# Patient Record
Sex: Female | Born: 1975 | Race: White | Hispanic: No | Marital: Single | Smoking: Current every day smoker
Health system: Southern US, Community
[De-identification: ages and names within clinical notes are randomized; demographics above are authoritative.]

## PROBLEM LIST (undated history)

## (undated) DIAGNOSIS — O24419 Gestational diabetes mellitus in pregnancy, unspecified control: Secondary | ICD-10-CM

## (undated) DIAGNOSIS — O3410 Maternal care for benign tumor of corpus uteri, unspecified trimester: Secondary | ICD-10-CM

## (undated) DIAGNOSIS — R87619 Unspecified abnormal cytological findings in specimens from cervix uteri: Secondary | ICD-10-CM

## (undated) DIAGNOSIS — D219 Benign neoplasm of connective and other soft tissue, unspecified: Secondary | ICD-10-CM

## (undated) DIAGNOSIS — K429 Umbilical hernia without obstruction or gangrene: Secondary | ICD-10-CM

## (undated) DIAGNOSIS — N879 Dysplasia of cervix uteri, unspecified: Secondary | ICD-10-CM

## (undated) DIAGNOSIS — D259 Leiomyoma of uterus, unspecified: Secondary | ICD-10-CM

## (undated) DIAGNOSIS — R5383 Other fatigue: Secondary | ICD-10-CM

## (undated) DIAGNOSIS — Z8742 Personal history of other diseases of the female genital tract: Secondary | ICD-10-CM

## (undated) HISTORY — DX: Gestational diabetes mellitus in pregnancy, unspecified control: O24.419

## (undated) HISTORY — DX: Dysplasia of cervix uteri, unspecified: N87.9

## (undated) HISTORY — DX: Benign neoplasm of connective and other soft tissue, unspecified: D21.9

## (undated) HISTORY — DX: Leiomyoma of uterus, unspecified: D25.9

## (undated) HISTORY — DX: Maternal care for benign tumor of corpus uteri, unspecified trimester: O34.10

## (undated) HISTORY — DX: Other fatigue: R53.83

## (undated) HISTORY — DX: Umbilical hernia without obstruction or gangrene: K42.9

## (undated) HISTORY — PX: CERVICAL CONE BIOPSY: SUR198

## (undated) HISTORY — DX: Unspecified abnormal cytological findings in specimens from cervix uteri: R87.619

## (undated) HISTORY — DX: Personal history of other diseases of the female genital tract: Z87.42

---

## 2003-08-17 ENCOUNTER — Observation Stay (HOSPITAL_COMMUNITY): Admission: EM | Admit: 2003-08-17 | Discharge: 2003-08-17 | Payer: Self-pay | Admitting: Emergency Medicine

## 2003-08-17 ENCOUNTER — Encounter: Payer: Self-pay | Admitting: Emergency Medicine

## 2003-08-25 ENCOUNTER — Encounter (HOSPITAL_COMMUNITY): Admission: RE | Admit: 2003-08-25 | Discharge: 2003-09-24 | Payer: Self-pay | Admitting: *Deleted

## 2006-02-22 ENCOUNTER — Ambulatory Visit (HOSPITAL_COMMUNITY): Admission: RE | Admit: 2006-02-22 | Discharge: 2006-02-22 | Payer: Self-pay | Admitting: Family Medicine

## 2006-05-03 ENCOUNTER — Ambulatory Visit (HOSPITAL_COMMUNITY): Admission: RE | Admit: 2006-05-03 | Discharge: 2006-05-03 | Payer: Self-pay | Admitting: Family Medicine

## 2006-09-14 ENCOUNTER — Encounter (INDEPENDENT_AMBULATORY_CARE_PROVIDER_SITE_OTHER): Payer: Self-pay | Admitting: Specialist

## 2006-09-14 ENCOUNTER — Inpatient Hospital Stay (HOSPITAL_COMMUNITY): Admission: RE | Admit: 2006-09-14 | Discharge: 2006-09-16 | Payer: Self-pay | Admitting: Obstetrics & Gynecology

## 2008-02-26 IMAGING — US US OB COMP +14 WK
1 series · 14 of 28 positions shown · non-contrast
Comparison: none

CLINICAL DATA: Evaluation of fetal anatomy.

[Series 1: unknown · 0.32mm/px · 14 of 68 slices shown]
[im 3/68]
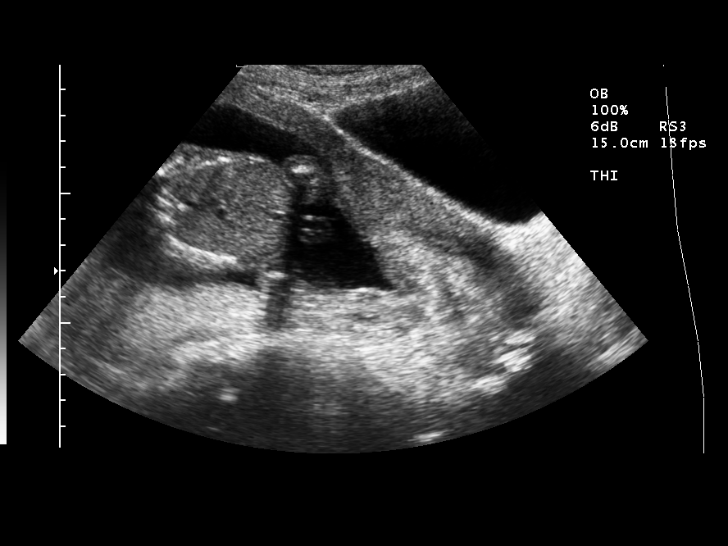
[im 8/68]
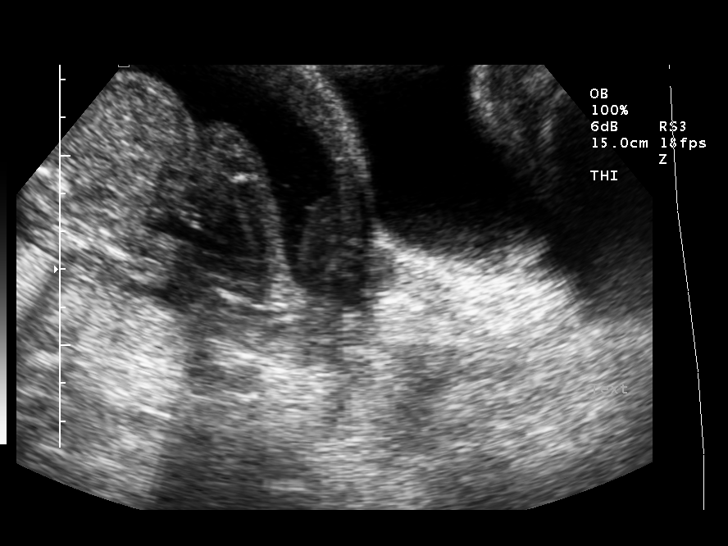
[im 13/68]
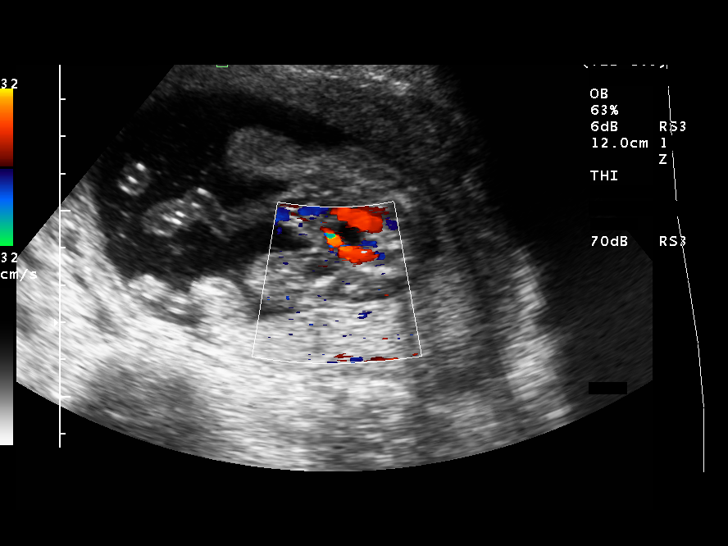
[im 18/68]
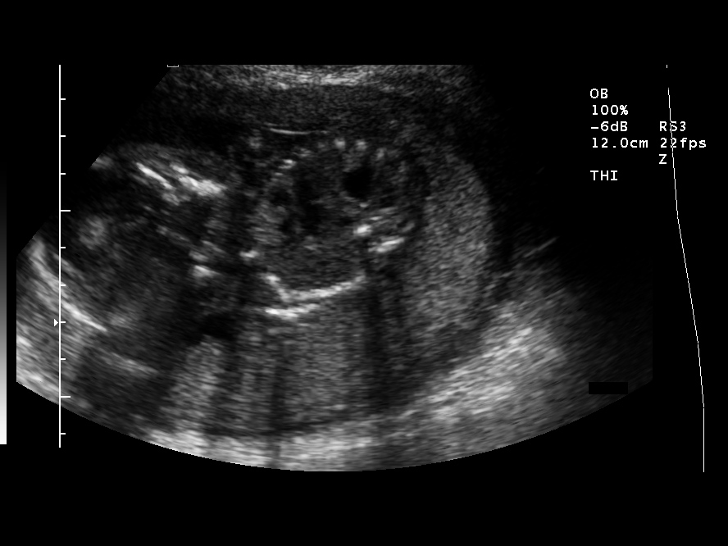
[im 23/68]
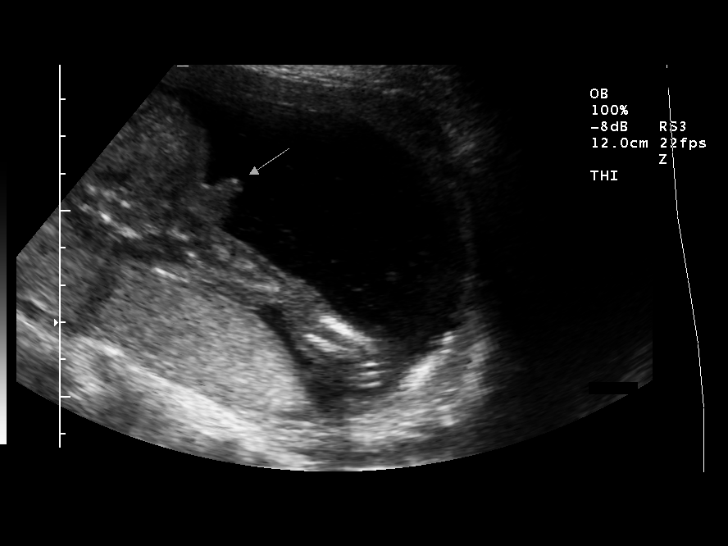
[im 28/68]
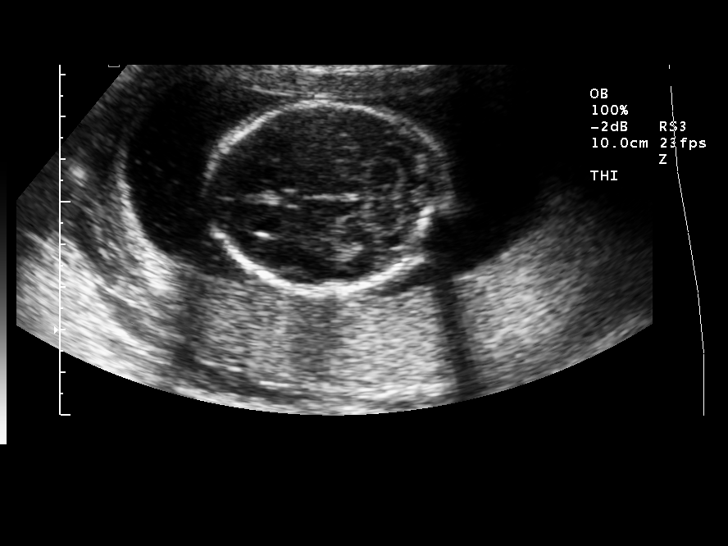
[im 33/68]
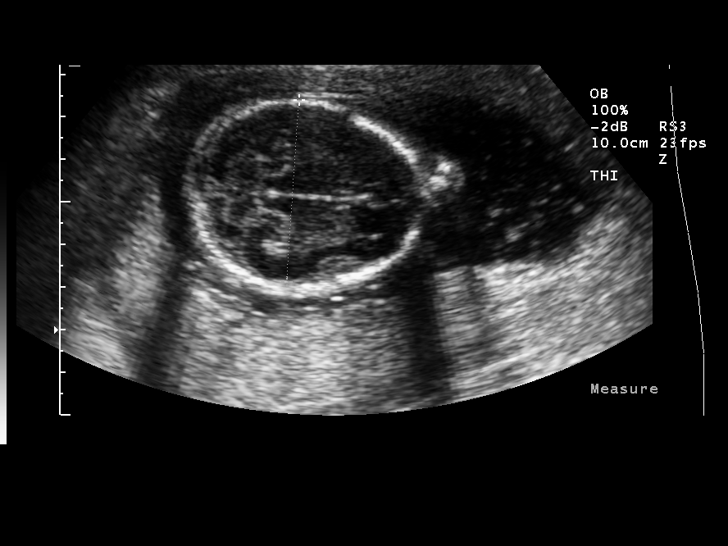
[im 38/68]
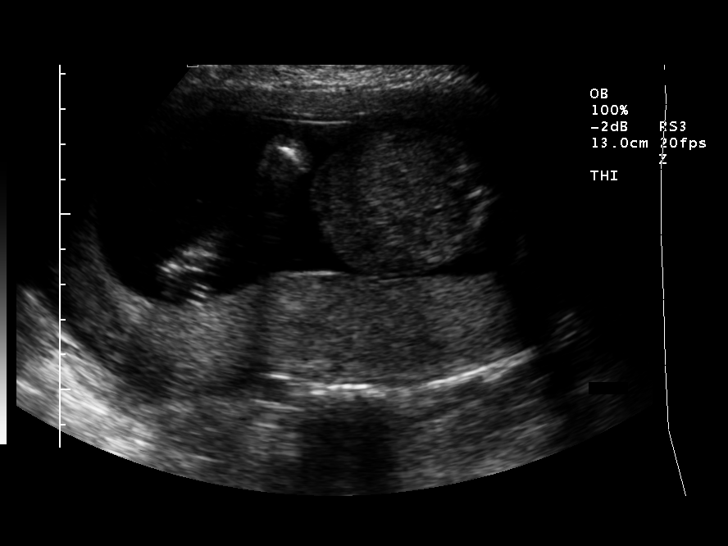
[im 43/68]
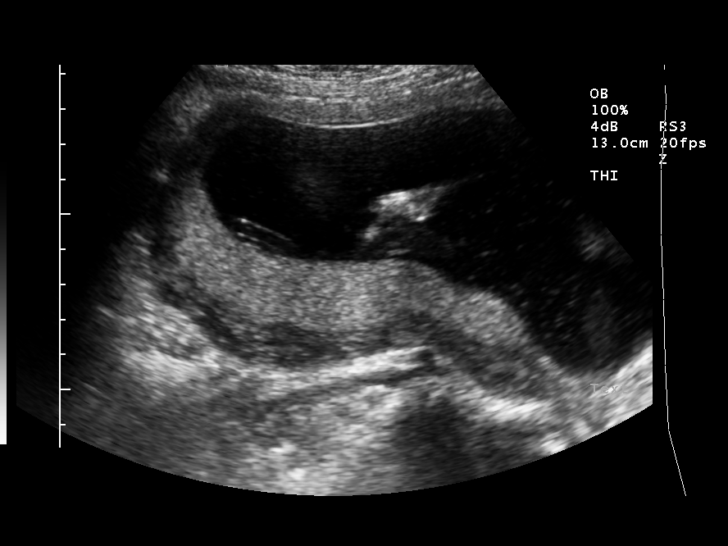
[im 48/68]
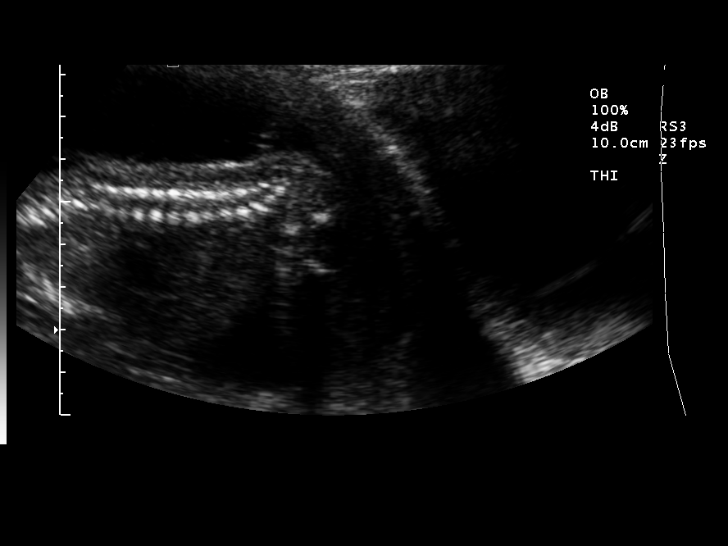
[im 53/68]
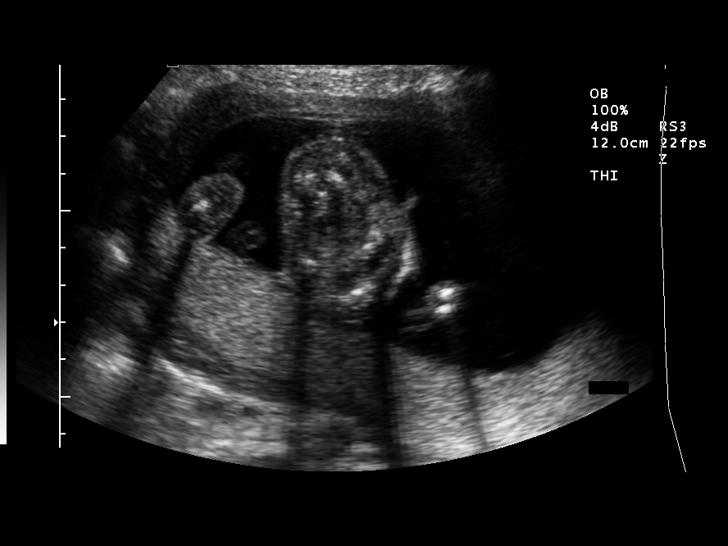
[im 58/68]
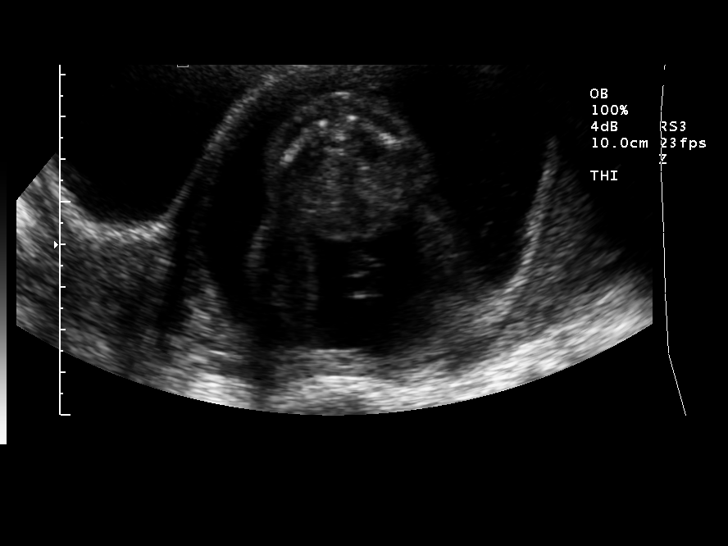
[im 63/68]
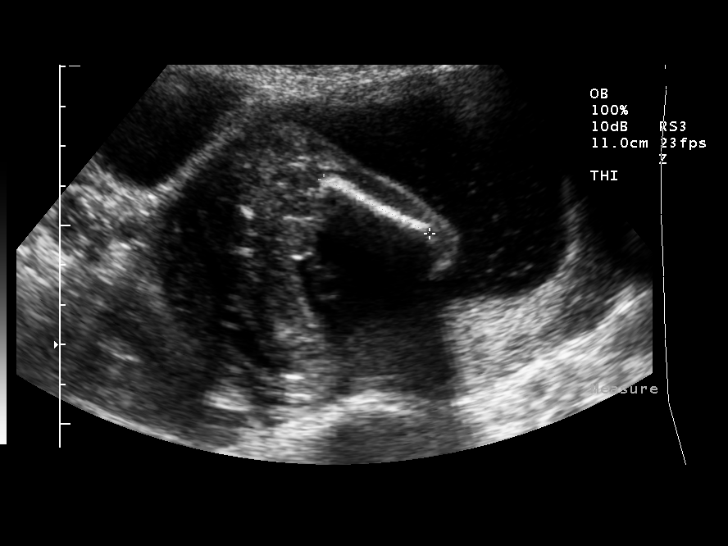
[im 68/68]
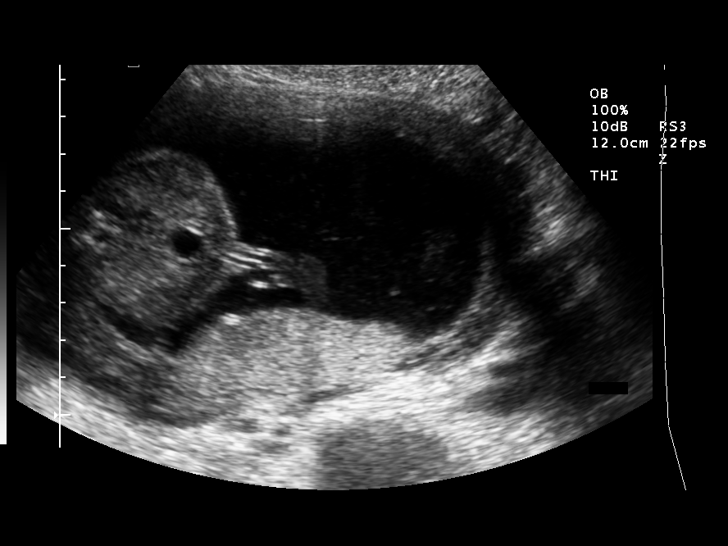

[14 of 28 positions shown; findings below may reference images not displayed]

OBSTETRICAL ULTRASOUND:
 Number of Fetuses: 1
 Heart Rate:  145
 Movement:  Yes
 Breathing:  No  
 Presentation:  Breech
 Placental Location:  Posterior
 Grade:  I
 Previa:  No
 Amniotic Fluid (Subjective):  Normal
 Amniotic Fluid (Objective): 

 FETAL BIOMETRY
 BPD:   4.3 cm   19 w 0 d
 HC:   16.3 cm   19 w 0 d
 AC:   14.3 cm   19 w 5 d
 FL:    2.9 cm   19 w 0 d

 MEAN GA:  19 w 1 d  US EDC:  09/26/06
 Assigned GA:  18 w 5 d  Assigned EDC:  09/29/06    

 FETAL ANATOMY
 Lateral Ventricles:    Visualized 
 Thalami/CSP:      Visualized 
 Posterior Fossa:  Visualized 
 Nuchal Region:    Visualized 
 Spine:      Visualized 
 4 Chamber Heart on Left:      Visualized 
 Stomach on Left:      Visualized 
 3 Vessel Cord:    Visualized 
 Cord Insertion site:    Visualized 
 Kidneys:  Visualized 
 Bladder:  Visualized 
 Extremities:      Visualized 

 ADDITIONAL ANATOMY VISUALIZED:  Diaphragm, 5th digit, and male genitalia.

 MATERNAL UTERINE AND ADNEXAL FINDINGS
 Cervix: 4.6 cm transabdominally
IMPRESSION: Normal appearing single intrauterine pregnancy of approximately 19 weeks 1 day gestation.

## 2010-04-08 ENCOUNTER — Emergency Department (HOSPITAL_COMMUNITY): Admission: EM | Admit: 2010-04-08 | Discharge: 2010-04-08 | Payer: Self-pay | Admitting: Emergency Medicine

## 2010-08-16 ENCOUNTER — Other Ambulatory Visit: Admission: RE | Admit: 2010-08-16 | Discharge: 2010-08-16 | Payer: Self-pay | Admitting: Obstetrics & Gynecology

## 2011-02-20 ENCOUNTER — Inpatient Hospital Stay (HOSPITAL_COMMUNITY)
Admission: AD | Admit: 2011-02-20 | Discharge: 2011-02-22 | DRG: 775 | Disposition: A | Discharge status: Home / Self Care | Payer: Medicaid Other | Source: Ambulatory Visit | Attending: Obstetrics & Gynecology | Admitting: Obstetrics & Gynecology

## 2011-02-20 DIAGNOSIS — O34219 Maternal care for unspecified type scar from previous cesarean delivery: Secondary | ICD-10-CM | POA: Diagnosis present

## 2011-02-20 LAB — CBC
HCT: 38.5 % (ref 36.0–46.0)
Hemoglobin: 13.1 g/dL (ref 12.0–15.0)
MCH: 33.3 pg (ref 26.0–34.0)
MCHC: 34 g/dL (ref 30.0–36.0)
MCV: 98 fL (ref 78.0–100.0)
Platelets: 142 10*3/uL — ABNORMAL LOW (ref 150–400)
RBC: 3.93 MIL/uL (ref 3.87–5.11)
RDW: 14 % (ref 11.5–15.5)
WBC: 7 10*3/uL (ref 4.0–10.5)

## 2011-02-20 LAB — RPR: RPR Ser Ql: NONREACTIVE

## 2011-03-09 NOTE — H&P (Signed)
NAME:  Madison [name], Madison [name]                       ACCOUNT NO.:  [id number]   MEDICAL RECORD NO.:  [id number]                   PATIENT TYPE:  OBV   LOCATION:  A428                                 FACILITY:  [facility]   PHYSICIAN:  Langley Gauss, M.D.                DATE OF BIRTH:  03/01/1976   DATE OF ADMISSION:  08/16/2003  DATE OF DISCHARGE:                                HISTORY & PHYSICAL   OBSERVATION NOTE   HISTORY OF PRESENT ILLNESS:  This is a 35 year old gravida 1 para 0 with a  normal last menstrual period of July 13, 2003 who presented to [facility] emergency room as an unassigned patient in the late p.m. of August 16, 2003.  The patient had had a positive pregnancy test three days previously  and had made arrangements to be seen at [facility] for a new  pregnancy.  However, on the date of service - August 16, 2003 - the patient  in the early afternoon began having some vagina spotting which was followed  by onset of mid pelvic and right lower quadrant pelvic pain at about 1900.  The patient presented and initial triage assessment in the emergency room  done 2151 on August 16, 2003.  The patient's presenting complaint was that  of pregnant, abdominal pain.  Stated she did have a home pregnancy test that  was positive, started spotting today, and has abdominal pain.   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  Vaginal spotting was also  occurring during that point in time.  The patient presented to the emergency  room with a moderately-severe pain but as she laid in the emergency room  awaiting and during her evaluation by 0100 August 17, 2003 the pain had  markedly improved.  Notably, the patient did not receive any narcotic  medication.   The patient describes menstrual periods as being regular,  predictable, on a  monthly basis, with four days of flow.  Last menstrual period was described  as normal.  She has been off birth control pills for greater than one year.  Of course, she is sexually active.  The patient does state that this is her  first pregnancy, she has no other pregnancies.  In addition, her past  history is pertinent for no prior history of GC or chlamydia and no prior  surgical procedures, no prior history of STDs or pelvic infections.  She has  been treated x1 for bacterial vaginosis with Flagyl.   PAST MEDICAL HISTORY:  She has an abnormal Pap smear four years previously  which was treated by freezing the cervix.  She states she has been followed  at [facility] and has had yearly Paps since that point  in time.  She  has no other medical or surgical history.   ALLERGIES:  No known drug allergies.   SOCIAL HISTORY:  Gravid 1 para 0.  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  The patient is noted to be a cigarette smoker.   PHYSICAL EXAMINATION:  GENERAL:  Initial evaluation by the triage staff  described the patient as looking sick with the chief complaint of abdominal  pain.  VITAL SIGNS IN EMERGENCY ROOM:  Temperature 97.3, blood pressure 102/68,  heart rate of 73, respiratory rate is 20.  HEENT:  Negative, no adenopathy.  Neck is supple, thyroid is nonpalpable,  and mucous membranes are moist.  LUNGS:  Clear.  CARDIOVASCULAR:  Regular rate and rhythm.  ABDOMEN:  Soft and nontender, nondistended.  There is mild tenderness in the  right lower quadrant as well as the suprapubic area.  No adnexal masses are  palpated.  No peritoneal signs, no rebound or guarding.  PELVIC:  Normal external genitalia. Cervix noted to be without lesions.  GC  and chlamydia culture is performed.  Bimanual examination shows the uterus  is normal size, mild tenderness within the right lower quadrant, no palpable  masses, suggestion of some free fluid within the right  adnexa.   LABORATORY DATA:  A quantitative beta hCG obtained is result of 541.  Urinalysis essentially completely negative with esterase negative, nitrites  negative, protein negative.  Wet prep pertinent for no yeast, no  Trichomonas, no clue cells.  O positive blood type.  Hemoglobin 12.1,  hematocrit 34.8, with a white count of 6.6.  Transvaginal ultrasound  performed August 17, 2003:  The hospital was able to call in a technician  for performance of this ultrasound.  The initial preliminary report obtained  was that of no intrauterine pregnancy, moderate free fluid in the pelvis,  normal left ovary, small complex cystic structure within the right adnexa  measured at 13 mm without increased vascularity.  Findings worrisome for  ectopic but not definitive.  Subsequently, a dictated final report is  obtained which reveals moderate amount of fluid within the pelvis, uterus  with normal uterine size and contour, endometrium and myometrium normal,  left ovary normal, right ovary shows two small cystic areas adjacent to or  pedunculated off the ovary inferiorly.  Final impression on the final report  is ectopic pregnancy cannot be ruled out.   ASSESSMENT:  The patient would be five and one-seventh weeks gestation based  upon a certain last menstrual period.  She presented to the emergency room  with signs and symptoms as well as findings worrisome for ectopic pregnancy.  In addition, the transvaginal ultrasound did not reveal an intrauterine  pregnancy.  The free fluid within the pelvis as well as the small complex  cystic area within right adnexa likewise were worrisome for an ectopic but  the quantitative beta hCG obtained is a very low number at 541.  At a low  number of 541 it is normal not to see evidence of any intrauterine  pregnancy.  In addition, the patient is evaluated by myself August 17, 2003 on the fourth floor with serial examinations.  Most pertinently, the patient  in  addition was likewise noted to have presented with a mid pelvic right  lower quadrant pain and some spotting.  During the p.m. and early a.m. of  August 17, 2003 this pain completely subsided with no additional spotting  and examination was completely benign with minimal tenderness  only within  the right lower quadrant.  The patient was ambulatory with no recurrence of  the pain.  She continued to have breast tenderness but no additional crampy  stabbing pain by symptoms or reproducible on examination.  She tolerated a  regular general diet, was very hungry, and was anxious for discharge.   DISPOSITION AT THIS TIME:  The patient still needs to be followed closely as  we have not documented intrauterine pregnancy.  The patient is advised that  should pelvic cramping recur or vaginal bleeding recur, she can contact us  and be seen in the office during the daytime or followed up through the  emergency room after hours.  She is given specific instructions to follow up  in 48 hours for repeat quantitative beta hCG a.m. of August 19, 2003.  Subsequently, she will have an ultrasound performed in our office at 1400 on  August 19, 2003.  Discussed at length with the patient her current  findings.  She is also made aware that we have not confirmed intrauterine  pregnancy nor have we completely ruled out an ectopic pregnancy.     ___________________________________________                                         Langley Gauss, M.D.   DC/MEDQ  D:  08/17/2003  T:  08/17/2003  Job:  [id number]   cc:   [facility]

## 2011-03-09 NOTE — H&P (Signed)
   NAME:  AMEIRAH, KHATOON                       ACCOUNT NO.:  [id number]   MEDICAL RECORD NO.:  [id number]                   PATIENT TYPE:  OBV   LOCATION:  A428                                 FACILITY:  [facility]   PHYSICIAN:  Langley Gauss, M.D.                DATE OF BIRTH:  [birth year]   DATE OF ADMISSION:  08/16/2003  DATE OF DISCHARGE:                                HISTORY & PHYSICAL   ADDENDUM:  The patient presented to the emergency room in the late p.m. of  August 16, 2003, with positive pregnancy test and cramping.  The patient  subsequently was evaluated by the physician's assistant, as well as by the  ER physician, Dr. Colon Branch.  After evaluation, I was contacted personally by  Dr. Colon Branch who summarized the patient's history.  The patient was clinically  stable, however, it was determined that an observation period in the  hospital would be required so that her clinical course should be followed.  Thus, I gave verbal orders to convert the patient to an observation status  on August 17, 2003.  The patient was subsequently completely managed by  myself on August 17, 2003.  After complete evaluation and observation  period, the patient likewise was discharged home on August 17, 2003.     ___________________________________________                                         Langley Gauss, M.D.   DC/MEDQ  D:  08/17/2003  T:  08/17/2003  Job:  [id number]

## 2011-03-09 NOTE — Discharge Summary (Signed)
Madison, [name] NO.:  [id number]   MEDICAL RECORD NO.:  [id number]          PATIENT TYPE:  INP   LOCATION:  A401                          FACILITY:  [facility]   PHYSICIAN:  Lazaro Arms, M.D.   DATE OF BIRTH:  Aug 30, 1976   DATE OF ADMISSION:  09/14/2006  DATE OF DISCHARGE:  11/26/2007LH                               DISCHARGE SUMMARY   DISCHARGE DIAGNOSES:  1. Status post a primary cesarean section due to cord presentation and      labor at 6 cm.  2. Unremarkable postoperative course.   PROCEDURE:  Primary cesarean section.   Please refer to the handwritten history and physical on the antepartum  chart for details of admission to the hospital.   HOSPITAL COURSE:  Patient was admitted 38-1/2 weeks in labor.  She was 4  cm at the time of admission and then pretty quickly went to 6 cm.  I was  examined her and was going to rupture her membranes when I felt the  umbilical cord pulsating.  As a result, I refrained from rupturing  membranes and made preparations to proceed with primary cesarean  section.  Please refer to the operative note for details of the cesarean  section.  It was uncomplicated.  She had an appropriate amount of blood  loss.  I had no intraoperative complications.  Postoperatively she did  well.  She tolerated clear liquids and a regular diet, voided without  symptoms and was extensively ambulatory.   Her postoperative day #1 hemoglobin was 10, hematocrit 28.7.  Again, she  remained afebrile throughout.  Her incision was clean, dry and intact at  the time of discharge.  She was discharged to home on the morning of  postoperative day #2 in good stable condition with follow-up in the  office next week to have her staples removed and incision evaluated.   DISCHARGE MEDICATIONS:  She was discharged to home on Tylox and Motrin.      Lazaro Arms, M.D.  Electronically Signed     LHE/MEDQ  D:  11/07/2006  T:  11/07/2006  Job:  [id number]

## 2011-03-09 NOTE — Op Note (Signed)
Madison [name], Madison [name] NO.:  [id number]   MEDICAL RECORD NO.:  [id number]          PATIENT TYPE:  INP   LOCATION:  A401                          FACILITY:  [facility]   PHYSICIAN:  Lazaro Arms, M.D.   DATE OF BIRTH:  04/21/1976   DATE OF PROCEDURE:  09/14/2006  DATE OF DISCHARGE:                                 OPERATIVE REPORT   PREOPERATIVE DIAGNOSES:  1. Intrauterine pregnancy at 38-1/[redacted] weeks gestation.  2. Labor at 6 cm.  3. Cord presentation.   POSTOPERATIVE DIAGNOSES:  1. Intrauterine pregnancy at 38-1/[redacted] weeks gestation.  2. Labor at 6 cm.  3. Cord presentation.   PROCEDURE:  Primary low transverse cesarean section.   SURGEON:  Lazaro Arms, M.D.   ANESTHESIA:  Spinal.   FINDINGS:  Patient came in this morning at 4 cm dilated in spontaneous  labor.  She progressed to 5, and when I checked her at approximately 12:45,  she was 6 cm.  I was going to rupture her membranes but felt umbilical cord  on the left side of the head.  I did a vaginal probe ultrasound that  confirmed the presentation of the cord.  As a result, I did not perform an  amniotomy and then performed a cesarean section.  She delivered a viable  female infant at 39 with Apgars of 9 and 9 with quick return to nursery.  There was three vessel cord.  Cord blood and cord gas sent.  Placenta was  normal and intact.  The uterus, tubes, and ovaries were normal.   DESCRIPTION OF OPERATION:  Patient was taken to the operating room, placed  in a sitting position with spinal anesthetic.  Prepped and draped in the  usual sterile fashion.  A Pfannenstiel skin incision was made, carried down  sharply to the rectus fascia, scored in midline, extended laterally.  The  fascia was taken off the muscle superiorly and inferiorly without  difficulty.  The muscles were divided.  The peritoneal cavity was entered.  A large protractor was placed.  The vesicouterine serosal flap was created.  A low transverse  hysterotomy incision was made.  Over the incision was  delivered a viable female infant at 56 with Apgars of 9 and 9 with quick  return to the nursery.  A three vessel cord, cord blood, and cord gas were  sent.  The infant was handed to Dr. Regino Schultze, who was in attendance, for  routine neonatal resuscitation.  The placenta was delivered spontaneously.  The uterus was exteriorized and closed in two layers, the first being a  running interlocking layer, the second being an imbricating layer.  Interrupted figure-of-eight sutures were also placed for additional  hemostasis.  The pelvis was irrigated vigorously, all pedicles found to be  hemostatic.  The protractor was removed.  The muscles and perineum were  closed in an interrupted fashion loosely.  The fascia was closed with 0  Vicryl running.  Subcutaneous tissue was made hemostatic and  irrigated.  The skin was closed using skin staples.  The patient tolerated  the procedure well.  She experienced 500 cc of blood loss, taken to the  recovery room in good, stable condition.  All counts were correct x3.  She  received Ancef prophylactically after the cord was clamped.      Lazaro Arms, M.D.  Electronically Signed     LHE/MEDQ  D:  09/14/2006  T:  09/14/2006  Job:  [id number]

## 2013-11-30 ENCOUNTER — Encounter: Payer: Self-pay | Admitting: Adult Health

## 2013-11-30 ENCOUNTER — Ambulatory Visit (INDEPENDENT_AMBULATORY_CARE_PROVIDER_SITE_OTHER): Payer: BC Managed Care – PPO | Admitting: Adult Health

## 2013-11-30 ENCOUNTER — Other Ambulatory Visit (HOSPITAL_COMMUNITY)
Admission: RE | Admit: 2013-11-30 | Discharge: 2013-11-30 | Disposition: A | Discharge status: Home / Self Care | Payer: BC Managed Care – PPO | Source: Ambulatory Visit | Attending: Adult Health | Admitting: Adult Health

## 2013-11-30 ENCOUNTER — Encounter (INDEPENDENT_AMBULATORY_CARE_PROVIDER_SITE_OTHER): Payer: Self-pay

## 2013-11-30 VITALS — BP 110/60 | HR 78 | Ht 62.0 in | Wt 166.0 lb

## 2013-11-30 DIAGNOSIS — Z8742 Personal history of other diseases of the female genital tract: Secondary | ICD-10-CM

## 2013-11-30 DIAGNOSIS — K429 Umbilical hernia without obstruction or gangrene: Secondary | ICD-10-CM

## 2013-11-30 DIAGNOSIS — D219 Benign neoplasm of connective and other soft tissue, unspecified: Secondary | ICD-10-CM

## 2013-11-30 DIAGNOSIS — Z01419 Encounter for gynecological examination (general) (routine) without abnormal findings: Secondary | ICD-10-CM | POA: Insufficient documentation

## 2013-11-30 DIAGNOSIS — R5383 Other fatigue: Secondary | ICD-10-CM

## 2013-11-30 DIAGNOSIS — Z1151 Encounter for screening for human papillomavirus (HPV): Secondary | ICD-10-CM | POA: Insufficient documentation

## 2013-11-30 HISTORY — DX: Benign neoplasm of connective and other soft tissue, unspecified: D21.9

## 2013-11-30 HISTORY — DX: Personal history of other diseases of the female genital tract: Z87.42

## 2013-11-30 HISTORY — DX: Other fatigue: R53.83

## 2013-11-30 LAB — CBC
HEMATOCRIT: 39.9 % (ref 36.0–46.0)
HEMOGLOBIN: 13.8 g/dL (ref 12.0–15.0)
MCH: 32.5 pg (ref 26.0–34.0)
MCHC: 34.6 g/dL (ref 30.0–36.0)
MCV: 93.9 fL (ref 78.0–100.0)
Platelets: 186 10*3/uL (ref 150–400)
RBC: 4.25 MIL/uL (ref 3.87–5.11)
RDW: 13.2 % (ref 11.5–15.5)
WBC: 6.5 10*3/uL (ref 4.0–10.5)

## 2013-11-30 NOTE — Patient Instructions (Signed)
Physical in 1 year Mammogram at 42 Take folic acid Follow up labs in 24-48 hours

## 2013-11-30 NOTE — Progress Notes (Signed)
Patient ID: Madison [name], female   DOB: [birth year], 38 y.o.   MRN: [id number] History of Present Illness: Madison [name] is a 38 year old white female in for a pap and physical.She does complain of being tired, periods heavy sometimes, has history of fibroids.She thinks she would like one more child.   Current Medications, Allergies, Past Medical History, Past Surgical History, Family History and Social History were reviewed in [employer] record.     Review of Systems: Patient denies any headaches, blurred vision, shortness of breath, chest pain, abdominal pain, problems with bowel movements(goes every 2-3 days), urination, or intercourse. No joint pain or mood swings.    Physical Exam:BP 110/60  Pulse 78  Ht 5\' 2"  (1.575 m)  Wt 166 lb (75.297 kg)  BMI 30.35 kg/m2  LMP 11/16/2013 General:  Well developed, well nourished, no acute distress Skin:  Warm and dry Neck:  Midline trachea, normal thyroid Lungs; Clear to auscultation bilaterally Breast:  No dominant palpable mass, retraction, or nipple discharge Cardiovascular: Regular rate and rhythm Abdomen:  Soft, non tender, no hepatosplenomegaly, has small umbilical hernia Pelvic:  External genitalia is normal in appearance.  The vagina is normal in appearance.  The cervix is bulbous.Pap with HPV performed.  Uterus is felt to be ULNS.  No adnexal masses or tenderness noted Extremities:  No swelling or varicosities noted Psych:  No mood changes, alert and cooperative, seems happy   Impression: Yearly gyn exam Fatigue Fibroids Umbilical hernia  History of abnormal pap    Plan: Take folic acid Check CBC,CMP,TSH and lipids and A1c Physical in 1 year Will follow up labs in 24-48 hours

## 2013-12-01 ENCOUNTER — Telehealth: Payer: Self-pay | Admitting: Adult Health

## 2013-12-01 LAB — HEMOGLOBIN A1C
HEMOGLOBIN A1C: 5.4 % (ref <5.7)
Mean Plasma Glucose: 108 mg/dL (ref <117)

## 2013-12-01 LAB — COMPREHENSIVE METABOLIC PANEL
ALBUMIN: 4.4 g/dL (ref 3.5–5.2)
ALK PHOS: 56 U/L (ref 39–117)
ALT: 14 U/L (ref 0–35)
AST: 15 U/L (ref 0–37)
BUN: 11 mg/dL (ref 6–23)
CALCIUM: 8.8 mg/dL (ref 8.4–10.5)
CHLORIDE: 104 meq/L (ref 96–112)
CO2: 27 meq/L (ref 19–32)
Creat: 0.75 mg/dL (ref 0.50–1.10)
GLUCOSE: 74 mg/dL (ref 70–99)
POTASSIUM: 3.9 meq/L (ref 3.5–5.3)
SODIUM: 139 meq/L (ref 135–145)
TOTAL PROTEIN: 6.5 g/dL (ref 6.0–8.3)
Total Bilirubin: 0.6 mg/dL (ref 0.2–1.2)

## 2013-12-01 LAB — LIPID PANEL
Cholesterol: 138 mg/dL (ref 0–200)
HDL: 46 mg/dL (ref >39)
LDL CALC: 70 mg/dL (ref 0–99)
Total CHOL/HDL Ratio: 3 Ratio
Triglycerides: 110 mg/dL (ref <150)
VLDL: 22 mg/dL (ref 0–40)

## 2013-12-01 LAB — TSH: TSH: 0.528 u[IU]/mL (ref 0.350–4.500)

## 2013-12-01 NOTE — Telephone Encounter (Signed)
Pt aware of labs  

## 2014-08-23 ENCOUNTER — Encounter: Payer: Self-pay | Admitting: Adult Health

## 2017-12-16 ENCOUNTER — Other Ambulatory Visit (HOSPITAL_COMMUNITY)
Admission: RE | Admit: 2017-12-16 | Discharge: 2017-12-16 | Disposition: A | Discharge status: Home / Self Care | Payer: BLUE CROSS/BLUE SHIELD | Source: Ambulatory Visit | Attending: Obstetrics & Gynecology | Admitting: Obstetrics & Gynecology

## 2017-12-16 ENCOUNTER — Ambulatory Visit (INDEPENDENT_AMBULATORY_CARE_PROVIDER_SITE_OTHER): Payer: BLUE CROSS/BLUE SHIELD | Admitting: Obstetrics & Gynecology

## 2017-12-16 ENCOUNTER — Other Ambulatory Visit: Payer: Self-pay

## 2017-12-16 ENCOUNTER — Encounter: Payer: Self-pay | Admitting: Obstetrics & Gynecology

## 2017-12-16 VITALS — BP 98/62 | HR 99 | Ht 62.0 in | Wt 125.0 lb

## 2017-12-16 DIAGNOSIS — Z01419 Encounter for gynecological examination (general) (routine) without abnormal findings: Secondary | ICD-10-CM | POA: Insufficient documentation

## 2017-12-16 NOTE — Progress Notes (Signed)
Subjective:     Madison [name] is a 42 y.o. female here for a routine exam.  Patient's last menstrual period was 12/09/2017 (exact date). [id number] Birth Control Method:  condoms Menstrual Calendar(currently): regular, a little irregylar lately  Current complaints: none.   Current acute medical issues:  none   Recent Gynecologic History Patient's last menstrual period was 12/09/2017 (exact date). Last Pap: 2015,  normal Last mammogram: after age 37 ,  normal  Past Medical History:  Diagnosis Date  . Abnormal Pap smear of cervix   . Dysplasia of cervix   . Fatigue 11/30/2013  . Fibroids 11/30/2013  . Gestational diabetes   . History of abnormal cervical Pap smear 11/30/2013  . Umbilical hernia   . Uterine fibroids affecting pregnancy     Past Surgical History:  Procedure Laterality Date  . CERVICAL CONE BIOPSY    . CESAREAN SECTION      OB History    Gravida Para Term Preterm AB Living   3 2 2   1 2    SAB TAB Ectopic Multiple Live Births       1   2      Social History   Socioeconomic History  . Marital status: Single    Spouse name: None  . Number of children: None  . Years of education: None  . Highest education level: None  Social Needs  . Financial resource strain: None  . Food insecurity - worry: None  . Food insecurity - inability: None  . Transportation needs - medical: None  . Transportation needs - non-medical: None  Occupational History  . None  Tobacco Use  . Smoking status: Current Every Day Smoker    Packs/day: 0.50    Types: Cigarettes  . Smokeless tobacco: Never Used  Substance and Sexual Activity  . Alcohol use: No    Frequency: Never    Comment: occ.  . Drug use: No  . Sexual activity: Yes    Birth control/protection: None, Condom  Other Topics Concern  . None  Social History Narrative  . None    Family History  Problem Relation Age of Onset  . Hypertension Mother   . Diabetes Mother   . Hypertension Father   . Diabetes Father    . Heart attack Father   . Hypertension Maternal Grandmother   . COPD Maternal Grandmother   . Hypertension Maternal Grandfather   . Hypertension Paternal Grandmother   . Hypertension Paternal Grandfather      Current Outpatient Medications:  Marland Kitchen  Multiple Vitamin (MULTIVITAMIN) tablet, Take 1 tablet by mouth daily., Disp: , Rfl:   Review of Systems  Review of Systems  Constitutional: Negative for fever, chills, weight loss, malaise/fatigue and diaphoresis.  HENT: Negative for hearing loss, ear pain, nosebleeds, congestion, sore throat, neck pain, tinnitus and ear discharge.   Eyes: Negative for blurred vision, double vision, photophobia, pain, discharge and redness.  Respiratory: Negative for cough, hemoptysis, sputum production, shortness of breath, wheezing and stridor.   Cardiovascular: Negative for chest pain, palpitations, orthopnea, claudication, leg swelling and PND.  Gastrointestinal: negative for abdominal pain. Negative for heartburn, nausea, vomiting, diarrhea, constipation, blood in stool and melena.  Genitourinary: Negative for dysuria, urgency, frequency, hematuria and flank pain.  Musculoskeletal: Negative for myalgias, back pain, joint pain and falls.  Skin: Negative for itching and rash.  Neurological: Negative for dizziness, tingling, tremors, sensory change, speech change, focal weakness, seizures, loss of consciousness, weakness and headaches.  Endo/Heme/Allergies: Negative for  environmental allergies and polydipsia. Does not bruise/bleed easily.  Psychiatric/Behavioral: Negative for depression, suicidal ideas, hallucinations, memory loss and substance abuse. The patient is not nervous/anxious and does not have insomnia.        Objective:  Blood pressure 98/62, pulse 99, height 5\' 2"  (1.575 m), weight 125 lb (56.7 kg), last menstrual period 12/09/2017.   Physical Exam  Vitals reviewed. Constitutional: She is oriented to person, place, and time. She appears  well-developed and well-nourished.  HENT:  Head: Normocephalic and atraumatic.        Right Ear: External ear normal.  Left Ear: External ear normal.  Nose: Nose normal.  Mouth/Throat: Oropharynx is clear and moist.  Eyes: Conjunctivae and EOM are normal. Pupils are equal, round, and reactive to light. Right eye exhibits no discharge. Left eye exhibits no discharge. No scleral icterus.  Neck: Normal range of motion. Neck supple. No tracheal deviation present. No thyromegaly present.  Cardiovascular: Normal rate, regular rhythm, normal heart sounds and intact distal pulses.  Exam reveals no gallop and no friction rub.   No murmur heard. Respiratory: Effort normal and breath sounds normal. No respiratory distress. She has no wheezes. She has no rales. She exhibits no tenderness.  GI: Soft. Bowel sounds are normal. She exhibits no distension and no mass. There is no tenderness. There is no rebound and no guarding.  Genitourinary:  Breasts no masses skin changes or nipple changes bilaterally      Vulva is normal without lesions Vagina is pink moist without discharge Cervix normal in appearance and pap is done Uterus is normal size shape and contour Adnexa is negative with normal sized ovaries   Musculoskeletal: Normal range of motion. She exhibits no edema and no tenderness.  Neurological: She is alert and oriented to person, place, and time. She has normal reflexes. She displays normal reflexes. No cranial nerve deficit. She exhibits normal muscle tone. Coordination normal.  Skin: Skin is warm and dry. No rash noted. No erythema. No pallor.  Psychiatric: She has a normal mood and affect. Her behavior is normal. Judgment and thought content normal.       Medications Ordered at today's visit: No orders of the defined types were placed in this encounter.   Other orders placed at today's visit: No orders of the defined types were placed in this encounter.     Assessment:    Healthy  female exam.   smoking cessation discussed Begin baby aspirin daily with FH of CVD/MI Plan:    Contraception: condoms. Follow up in: 1 year.     Return in about 1 year (around 12/16/2018) for yearly, with Dr Elonda Husky.

## 2017-12-17 LAB — CYTOLOGY - PAP
ADEQUACY: ABSENT
DIAGNOSIS: NEGATIVE
HPV: NOT DETECTED

## 2018-04-21 ENCOUNTER — Emergency Department (HOSPITAL_COMMUNITY)
Admission: EM | Admit: 2018-04-21 | Discharge: 2018-04-22 | Disposition: A | Discharge status: Home / Self Care | Payer: BLUE CROSS/BLUE SHIELD | Attending: Obstetrics & Gynecology | Admitting: Obstetrics & Gynecology

## 2018-04-21 ENCOUNTER — Encounter (HOSPITAL_COMMUNITY): Payer: Self-pay | Admitting: Emergency Medicine

## 2018-04-21 ENCOUNTER — Other Ambulatory Visit: Payer: Self-pay

## 2018-04-21 DIAGNOSIS — O00101 Right tubal pregnancy without intrauterine pregnancy: Secondary | ICD-10-CM | POA: Diagnosis not present

## 2018-04-21 DIAGNOSIS — R1084 Generalized abdominal pain: Secondary | ICD-10-CM | POA: Diagnosis present

## 2018-04-21 DIAGNOSIS — Z3490 Encounter for supervision of normal pregnancy, unspecified, unspecified trimester: Secondary | ICD-10-CM | POA: Diagnosis present

## 2018-04-21 DIAGNOSIS — F17210 Nicotine dependence, cigarettes, uncomplicated: Secondary | ICD-10-CM | POA: Diagnosis not present

## 2018-04-21 DIAGNOSIS — K661 Hemoperitoneum: Secondary | ICD-10-CM | POA: Insufficient documentation

## 2018-04-21 LAB — COMPREHENSIVE METABOLIC PANEL
ALK PHOS: 45 U/L (ref 38–126)
ALT: 9 U/L (ref 0–44)
ANION GAP: 10 (ref 5–15)
AST: 17 U/L (ref 15–41)
Albumin: 4.4 g/dL (ref 3.5–5.0)
BUN: 19 mg/dL (ref 6–20)
CO2: 24 mmol/L (ref 22–32)
Calcium: 8.8 mg/dL — ABNORMAL LOW (ref 8.9–10.3)
Chloride: 103 mmol/L (ref 98–111)
Creatinine, Ser: 0.71 mg/dL (ref 0.44–1.00)
GFR calc non Af Amer: >60 mL/min (ref >60)
GLUCOSE: 101 mg/dL — AB (ref 70–99)
POTASSIUM: 3.5 mmol/L (ref 3.5–5.1)
SODIUM: 137 mmol/L (ref 135–145)
TOTAL PROTEIN: 7.4 g/dL (ref 6.5–8.1)
Total Bilirubin: 0.8 mg/dL (ref 0.3–1.2)

## 2018-04-21 LAB — CBC
HEMATOCRIT: 32.4 % — AB (ref 36.0–46.0)
HEMOGLOBIN: 11.3 g/dL — AB (ref 12.0–15.0)
MCH: 33.8 pg (ref 26.0–34.0)
MCHC: 34.9 g/dL (ref 30.0–36.0)
MCV: 97 fL (ref 78.0–100.0)
Platelets: 180 10*3/uL (ref 150–400)
RBC: 3.34 MIL/uL — AB (ref 3.87–5.11)
RDW: 12.1 % (ref 11.5–15.5)
WBC: 13.9 10*3/uL — ABNORMAL HIGH (ref 4.0–10.5)

## 2018-04-21 LAB — LIPASE, BLOOD: Lipase: 30 U/L (ref 11–51)

## 2018-04-21 LAB — HCG, QUANTITATIVE, PREGNANCY: hCG, Beta Chain, Quant, S: 32936 m[IU]/mL — ABNORMAL HIGH (ref <5)

## 2018-04-21 MED ORDER — SODIUM CHLORIDE 0.9 % IV SOLN: Freq: Once | INTRAVENOUS | Status: DC

## 2018-04-21 MED ORDER — SODIUM CHLORIDE 0.9 % IV BOLUS (SEPSIS)
1000.0000 mL | Freq: Once | INTRAVENOUS | Status: AC
  Administered 2018-04-22: 1000 mL via INTRAVENOUS

## 2018-04-21 NOTE — ED Triage Notes (Addendum)
Pt C/O suprapubic abdominal pain that feels like "it is ripping." Pt states she took a pregnancy test that came back positive today. Pt has Hx of ectopic pregnancy.

## 2018-04-22 ENCOUNTER — Encounter (HOSPITAL_COMMUNITY)
Admission: EM | Disposition: A | Discharge status: Home / Self Care | Payer: Self-pay | Source: Home / Self Care | Attending: Emergency Medicine

## 2018-04-22 ENCOUNTER — Emergency Department (HOSPITAL_COMMUNITY): Payer: BLUE CROSS/BLUE SHIELD | Admitting: Anesthesiology

## 2018-04-22 DIAGNOSIS — O009 Unspecified ectopic pregnancy without intrauterine pregnancy: Secondary | ICD-10-CM | POA: Diagnosis not present

## 2018-04-22 DIAGNOSIS — O00101 Right tubal pregnancy without intrauterine pregnancy: Secondary | ICD-10-CM

## 2018-04-22 HISTORY — PX: BILATERAL SALPINGECTOMY: SHX5743

## 2018-04-22 HISTORY — PX: DIAGNOSTIC LAPAROSCOPY WITH REMOVAL OF ECTOPIC PREGNANCY: SHX6449

## 2018-04-22 LAB — URINALYSIS, ROUTINE W REFLEX MICROSCOPIC
Bacteria, UA: NONE SEEN
Bilirubin Urine: NEGATIVE
Glucose, UA: NEGATIVE mg/dL
Ketones, ur: 20 mg/dL — AB
Leukocytes, UA: NEGATIVE
Nitrite: NEGATIVE
PROTEIN: NEGATIVE mg/dL
Specific Gravity, Urine: 1.015 (ref 1.005–1.030)
pH: 5 (ref 5.0–8.0)

## 2018-04-22 LAB — TYPE AND SCREEN
ABO/RH(D): O POS
Antibody Screen: NEGATIVE

## 2018-04-22 LAB — POC URINE PREG, ED: PREG TEST UR: POSITIVE — AB

## 2018-04-22 SURGERY — LAPAROSCOPY, WITH ECTOPIC PREGNANCY SURGICAL TREATMENT
Anesthesia: General | Site: Vagina | Laterality: Right

## 2018-04-22 MED ORDER — PROMETHAZINE HCL 25 MG PO TABS: 25.0000 mg | ORAL_TABLET | Freq: Four times a day (QID) | ORAL | 1 refills | Status: AC | PRN

## 2018-04-22 MED ORDER — CEFAZOLIN SODIUM-DEXTROSE 2-4 GM/100ML-% IV SOLN
INTRAVENOUS | Status: AC
  Filled 2018-04-22: qty 100

## 2018-04-22 MED ORDER — LACTATED RINGERS IV SOLN
INTRAVENOUS | Status: DC | PRN
  Administered 2018-04-22: 02:00:00 via INTRAVENOUS

## 2018-04-22 MED ORDER — HYDROCODONE-ACETAMINOPHEN 7.5-325 MG PO TABS: 1.0000 | ORAL_TABLET | Freq: Once | ORAL | Status: DC | PRN

## 2018-04-22 MED ORDER — GLYCOPYRROLATE 0.2 MG/ML IJ SOLN
INTRAMUSCULAR | Status: DC | PRN
  Administered 2018-04-22: 0.6 mg via INTRAVENOUS

## 2018-04-22 MED ORDER — NEOSTIGMINE METHYLSULFATE 10 MG/10ML IV SOLN
INTRAVENOUS | Status: AC
  Filled 2018-04-22: qty 1

## 2018-04-22 MED ORDER — SUCCINYLCHOLINE CHLORIDE 20 MG/ML IJ SOLN
INTRAMUSCULAR | Status: DC | PRN
  Administered 2018-04-22: 120 mg via INTRAVENOUS

## 2018-04-22 MED ORDER — HYDROMORPHONE HCL 1 MG/ML IJ SOLN: 0.2500 mg | INTRAMUSCULAR | Status: DC | PRN

## 2018-04-22 MED ORDER — BUPIVACAINE LIPOSOME 1.3 % IJ SUSP
INTRAMUSCULAR | Status: AC
  Filled 2018-04-22: qty 20

## 2018-04-22 MED ORDER — MIDAZOLAM HCL 2 MG/2ML IJ SOLN
INTRAMUSCULAR | Status: AC
  Filled 2018-04-22: qty 2

## 2018-04-22 MED ORDER — 0.9 % SODIUM CHLORIDE (POUR BTL) OPTIME
TOPICAL | Status: DC | PRN
  Administered 2018-04-22: 1000 mL

## 2018-04-22 MED ORDER — ONDANSETRON HCL 4 MG/2ML IJ SOLN
INTRAMUSCULAR | Status: DC | PRN
  Administered 2018-04-22: 4 mg via INTRAVENOUS

## 2018-04-22 MED ORDER — SODIUM CHLORIDE 0.9% FLUSH
INTRAVENOUS | Status: AC
  Filled 2018-04-22: qty 10

## 2018-04-22 MED ORDER — PHENYLEPHRINE HCL 10 MG/ML IJ SOLN
INTRAMUSCULAR | Status: AC
  Filled 2018-04-22: qty 1

## 2018-04-22 MED ORDER — NEOSTIGMINE METHYLSULFATE 10 MG/10ML IV SOLN
INTRAVENOUS | Status: DC | PRN
  Administered 2018-04-22: 3 mg via INTRAVENOUS

## 2018-04-22 MED ORDER — BUPIVACAINE LIPOSOME 1.3 % IJ SUSP
INTRAMUSCULAR | Status: DC | PRN
  Administered 2018-04-22: 20 mL

## 2018-04-22 MED ORDER — MIDAZOLAM HCL 2 MG/2ML IJ SOLN
INTRAMUSCULAR | Status: DC | PRN
  Administered 2018-04-22: 2 mg via INTRAVENOUS

## 2018-04-22 MED ORDER — SODIUM CHLORIDE 0.9 % IR SOLN
Status: DC | PRN
  Administered 2018-04-22: 3000 mL

## 2018-04-22 MED ORDER — KETOROLAC TROMETHAMINE 10 MG PO TABS: 10.0000 mg | ORAL_TABLET | Freq: Three times a day (TID) | ORAL | 0 refills | Status: AC | PRN

## 2018-04-22 MED ORDER — MEPERIDINE HCL 50 MG/ML IJ SOLN: 6.2500 mg | INTRAMUSCULAR | Status: DC | PRN

## 2018-04-22 MED ORDER — PROPOFOL 10 MG/ML IV BOLUS
INTRAVENOUS | Status: DC | PRN
  Administered 2018-04-22: 40 mg via INTRAVENOUS
  Administered 2018-04-22: 125 mg via INTRAVENOUS

## 2018-04-22 MED ORDER — KETOROLAC TROMETHAMINE 30 MG/ML IJ SOLN
INTRAMUSCULAR | Status: AC
  Filled 2018-04-22: qty 1

## 2018-04-22 MED ORDER — CEFAZOLIN SODIUM-DEXTROSE 2-4 GM/100ML-% IV SOLN
2.0000 g | INTRAVENOUS | Status: AC
  Administered 2018-04-22: 2 g via INTRAVENOUS
  Filled 2018-04-22: qty 100

## 2018-04-22 MED ORDER — FENTANYL CITRATE (PF) 100 MCG/2ML IJ SOLN
INTRAMUSCULAR | Status: AC
  Filled 2018-04-22: qty 2

## 2018-04-22 MED ORDER — LACTATED RINGERS IV SOLN: INTRAVENOUS | Status: DC

## 2018-04-22 MED ORDER — FENTANYL CITRATE (PF) 100 MCG/2ML IJ SOLN
INTRAMUSCULAR | Status: DC | PRN
  Administered 2018-04-22: 100 ug via INTRAVENOUS

## 2018-04-22 MED ORDER — HYDROCODONE-ACETAMINOPHEN 5-325 MG PO TABS: 1.0000 | ORAL_TABLET | Freq: Four times a day (QID) | ORAL | 0 refills | Status: AC | PRN

## 2018-04-22 MED ORDER — PHENYLEPHRINE HCL 10 MG/ML IJ SOLN
INTRAMUSCULAR | Status: DC | PRN
  Administered 2018-04-22: 50 ug via INTRAVENOUS
  Administered 2018-04-22: 100 ug via INTRAVENOUS

## 2018-04-22 MED ORDER — ROCURONIUM BROMIDE 100 MG/10ML IV SOLN
INTRAVENOUS | Status: DC | PRN
  Administered 2018-04-22: 30 mg via INTRAVENOUS

## 2018-04-22 MED ORDER — KETOROLAC TROMETHAMINE 30 MG/ML IJ SOLN
30.0000 mg | Freq: Once | INTRAMUSCULAR | Status: AC
  Administered 2018-04-22: 30 mg via INTRAVENOUS

## 2018-04-22 MED ORDER — LACTATED RINGERS IV SOLN
INTRAVENOUS | Status: DC
  Administered 2018-04-22: 1000 mL via INTRAVENOUS

## 2018-04-22 MED ORDER — PROMETHAZINE HCL 25 MG/ML IJ SOLN
6.2500 mg | INTRAMUSCULAR | Status: DC | PRN
  Administered 2018-04-22: 12.5 mg via INTRAVENOUS
  Filled 2018-04-22: qty 1

## 2018-04-22 SURGICAL SUPPLY — 48 items
APPLIER CLIP LAPSCP 10X32 DD (CLIP) ×2 IMPLANT
BAG RETRIEVAL 10 (BASKET) ×1
BAG RETRIEVAL 10MM (BASKET) ×1
BLADE 11 SAFETY STRL DISP (BLADE) ×4 IMPLANT
CLOTH BEACON ORANGE TIMEOUT ST (SAFETY) ×4 IMPLANT
COVER LIGHT HANDLE STERIS (MISCELLANEOUS) ×8 IMPLANT
DRAPE PROXIMA HALF (DRAPES) ×4 IMPLANT
DRSG TEGADERM 2-3/8X2-3/4 SM (GAUZE/BANDAGES/DRESSINGS) ×6 IMPLANT
ELECT REM PT RETURN 9FT ADLT (ELECTROSURGICAL) ×4
ELECTRODE REM PT RTRN 9FT ADLT (ELECTROSURGICAL) ×2 IMPLANT
FILTER SMOKE EVAC LAPAROSHD (FILTER) ×4 IMPLANT
FORMALIN 10 PREFIL 480ML (MISCELLANEOUS) ×4 IMPLANT
GAUZE SPONGE 4X4 16PLY XRAY LF (GAUZE/BANDAGES/DRESSINGS) ×2 IMPLANT
GLOVE BIOGEL PI IND STRL 7.0 (GLOVE) ×2 IMPLANT
GLOVE BIOGEL PI IND STRL 8 (GLOVE) ×2 IMPLANT
GLOVE BIOGEL PI INDICATOR 7.0 (GLOVE) ×2
GLOVE BIOGEL PI INDICATOR 8 (GLOVE) ×2
GLOVE ECLIPSE 8.0 STRL XLNG CF (GLOVE) ×4 IMPLANT
GOWN STRL REUS W/TWL LRG LVL3 (GOWN DISPOSABLE) ×4 IMPLANT
GOWN STRL REUS W/TWL XL LVL3 (GOWN DISPOSABLE) ×4 IMPLANT
INST SET LAPROSCOPIC GYN AP (KITS) ×4 IMPLANT
IV NS IRRIG 3000ML ARTHROMATIC (IV SOLUTION) ×4 IMPLANT
KIT TURNOVER KIT A (KITS) ×4 IMPLANT
MANIFOLD NEPTUNE II (INSTRUMENTS) ×4 IMPLANT
NDL HYPO 18GX1.5 BLUNT FILL (NEEDLE) ×2 IMPLANT
NDL HYPO 25X1 1.5 SAFETY (NEEDLE) ×2 IMPLANT
NEEDLE HYPO 18GX1.5 BLUNT FILL (NEEDLE) ×4 IMPLANT
NEEDLE HYPO 25X1 1.5 SAFETY (NEEDLE) ×4 IMPLANT
PACK PERI GYN (CUSTOM PROCEDURE TRAY) ×4 IMPLANT
PAD ARMBOARD 7.5X6 YLW CONV (MISCELLANEOUS) ×4 IMPLANT
SET BASIN LINEN APH (SET/KITS/TRAYS/PACK) ×4 IMPLANT
SET TUBE IRRIG SUCTION NO TIP (IRRIGATION / IRRIGATOR) ×4 IMPLANT
SHEARS HARMONIC ACE PLUS 36CM (ENDOMECHANICALS) ×4 IMPLANT
SLEEVE ENDOPATH XCEL 5M (ENDOMECHANICALS) ×4 IMPLANT
SOLUTION ANTI FOG 6CC (MISCELLANEOUS) ×4 IMPLANT
SPONGE GAUZE 2X2 8PLY STER LF (GAUZE/BANDAGES/DRESSINGS) ×3
SPONGE GAUZE 2X2 8PLY STRL LF (GAUZE/BANDAGES/DRESSINGS) ×3 IMPLANT
STAPLER VISISTAT 35W (STAPLE) ×4 IMPLANT
SUT VICRYL 0 UR6 27IN ABS (SUTURE) ×4 IMPLANT
SYR 10ML LL (SYRINGE) ×4 IMPLANT
SYR 20CC LL (SYRINGE) ×4 IMPLANT
SYS BAG RETRIEVAL 10MM (BASKET) ×2
SYSTEM BAG RETRIEVAL 10MM (BASKET) IMPLANT
TRAY FOLEY CATH SILVER 16FR (SET/KITS/TRAYS/PACK) ×4 IMPLANT
TROCAR ENDO BLADELESS 11MM (ENDOMECHANICALS) ×4 IMPLANT
TROCAR XCEL NON-BLD 5MMX100MML (ENDOMECHANICALS) ×4 IMPLANT
TUBING INSUF HEATED (TUBING) ×4 IMPLANT
WARMER LAPAROSCOPE (MISCELLANEOUS) ×4 IMPLANT

## 2018-04-22 NOTE — Transfer of Care (Signed)
Immediate Anesthesia Transfer of Care Note  Patient: Madison [name]  Procedure(s) Performed: DIAGNOSTIC LAPAROSCOPY WITH REMOVAL OF ECTOPIC PREGNANCY (N/A Vagina ) RIGHT   SALPINGECTOMY (Right )  Patient Location: PACU  Anesthesia Type:General  Level of Consciousness: awake, alert  and oriented  Airway & Oxygen Therapy: Patient Spontanous Breathing  Post-op Assessment: Report given to RN  Post vital signs: Reviewed and stable  Last Vitals:  Vitals Value Taken Time  BP 107/65 04/22/2018  3:36 AM  Temp    Pulse 106 04/22/2018  3:36 AM  Resp    SpO2 95 % 04/22/2018  3:36 AM  Vitals shown include unvalidated device data.  Last Pain:  Vitals:   04/21/18 2213  TempSrc:   PainSc: 10-Worst pain ever         Complications: No apparent anesthesia complications

## 2018-04-22 NOTE — ED Provider Notes (Signed)
[facility] EMERGENCY DEPARTMENT Provider Note   CSN: [id number] Arrival date & time: 04/21/18  2205     History   Chief Complaint Chief Complaint  Patient presents with  . Abdominal Pain    Pt seen with NP student, I performed history/physical/documentation HPI Madison [name] is a 42 y.o. female.  The history is provided by the patient. The history is limited by the condition of the patient.  Abdominal Pain   This is a new problem. The current episode started yesterday. The problem occurs constantly. The problem has been rapidly worsening. The pain is located in the generalized abdominal region. The pain is severe. Associated symptoms include nausea. Pertinent negatives include fever, diarrhea and vomiting. The symptoms are aggravated by certain positions and palpation. The symptoms are relieved by being still.   Patient presents with abdominal pain.  She reports it started yesterday morning and has been worsening.  No vaginal bleeding.  She reports distant history of ectopic pregnancy that was treated with methotrexate Reports home pregnancy test was positive. Past Medical History:  Diagnosis Date  . Abnormal Pap smear of cervix   . Dysplasia of cervix   . Fatigue 11/30/2013  . Fibroids 11/30/2013  . Gestational diabetes   . History of abnormal cervical Pap smear 11/30/2013  . Umbilical hernia   . Uterine fibroids affecting pregnancy     Patient Active Problem List   Diagnosis Date Noted  . Fibroids 11/30/2013  . Umbilical hernia 23/76/2831  . History of abnormal cervical Pap smear 11/30/2013  . Fatigue 11/30/2013    Past Surgical History:  Procedure Laterality Date  . CERVICAL CONE BIOPSY    . CESAREAN SECTION       OB History    Gravida  4   Para  2   Term  2   Preterm      AB  1   Living  2     SAB      TAB      Ectopic  1   Multiple      Live Births  2            Home Medications    Prior to Admission medications   Medication Sig  Start Date End Date Taking? Authorizing Provider  Multiple Vitamin (MULTIVITAMIN) tablet Take 1 tablet by mouth daily.    [provider]    Family History Family History  Problem Relation Age of Onset  . Hypertension Mother   . Diabetes Mother   . Hypertension Father   . Diabetes Father   . Heart attack Father   . Hypertension Maternal Grandmother   . COPD Maternal Grandmother   . Hypertension Maternal Grandfather   . Hypertension Paternal Grandmother   . Hypertension Paternal Grandfather     Social History Social History   Tobacco Use  . Smoking status: Current Every Day Smoker    Packs/day: 0.50    Types: Cigarettes  . Smokeless tobacco: Never Used  Substance Use Topics  . Alcohol use: No    Frequency: Never    Comment: occ.  . Drug use: No     Allergies   Patient has no known allergies.   Review of Systems Review of Systems  Constitutional: Negative for fever.  Gastrointestinal: Positive for abdominal pain and nausea. Negative for diarrhea and vomiting.  Genitourinary: Negative for vaginal bleeding.  All other systems reviewed and are negative.    Physical Exam Updated Vital Signs BP (!) 122/100 (  BP Location: Right Arm)   Pulse (!) 123   Temp 98.1 F (36.7 C) (Temporal)   Resp 20   Ht 1.575 m (5\' 2" )   Wt 59 kg (130 lb)   LMP 12/09/2017 (Exact Date)   SpO2 100%   BMI 23.78 kg/m   Physical Exam CONSTITUTIONAL: Well developed/well nourished HEAD: Normocephalic/atraumatic EYES: EOMI/PERRL ENMT: Mucous membranes moist NECK: supple no meningeal signs SPINE/BACK:entire spine nontender CV: S1/S2 noted, no murmurs/rubs/gallops noted LUNGS: Lungs are clear to auscultation bilaterally, no apparent distress ABDOMEN: soft, diffuse significant tenderness, guarding, bowel sounds noted throughout abdomen GU:no cva tenderness NEURO: Pt is awake/alert/appropriate, moves all extremitiesx4.  No facial droop.   EXTREMITIES: pulses normal/equal, full  ROM SKIN: warm, color normal PSYCH: no abnormalities of mood noted, alert and oriented to situation   ED Treatments / Results  Labs (all labs ordered are listed, but only abnormal results are displayed) Labs Reviewed  COMPREHENSIVE METABOLIC PANEL - Abnormal; Notable for the following components:      Result Value   Glucose, Bld 101 (*)    Calcium 8.8 (*)    All other components within normal limits  CBC - Abnormal; Notable for the following components:   WBC 13.9 (*)    RBC 3.34 (*)    Hemoglobin 11.3 (*)    HCT 32.4 (*)    All other components within normal limits  URINALYSIS, ROUTINE W REFLEX MICROSCOPIC - Abnormal; Notable for the following components:   APPearance HAZY (*)    Hgb urine dipstick SMALL (*)    Ketones, ur 20 (*)    All other components within normal limits  HCG, QUANTITATIVE, PREGNANCY - Abnormal; Notable for the following components:   hCG, Beta Chain, Quant, S 32,936 (*)    All other components within normal limits  POC URINE PREG, ED - Abnormal; Notable for the following components:   Preg Test, Ur POSITIVE (*)    All other components within normal limits  LIPASE, BLOOD  TYPE AND SCREEN    EKG None  Radiology No results found.  Procedures Procedures  CRITICAL CARE Performed by: Sharyon Cable Total critical care time: 35 minutes Critical care time was exclusive of separately billable procedures and treating other patients. Critical care was necessary to treat or prevent imminent or life-threatening deterioration. Critical care was time spent personally by me on the following activities: development of treatment plan with patient and/or surrogate as well as nursing, discussions with consultants, evaluation of patient's response to treatment, examination of patient, obtaining history from patient or surrogate, ordering and performing treatments and interventions, ordering and review of laboratory studies, ordering and review of radiographic  studies, pulse oximetry and re-evaluation of patient's condition. Patient with ectopic pregnancy requiring operative management   EMERGENCY DEPARTMENT [place] PREGNANCY "Study: Limited Ultrasound of the Pelvis for Pregnancy"  INDICATIONS:Pregnancy(required) Multiple views of the uterus and pelvic cavity were obtained in real-time with a multi-frequency probe.  APPROACH:Transabdominal  PERFORMED BY: Myself IMAGES ARCHIVED?: Yes LIMITATIONS: Decompressed bladder PREGNANCY FREE FLUID: Present INTERPRETATION: Uterus does not show signs of pregnancy.  She has free throughout throughout abdomen   EMERGENCY DEPARTMENT [place] FAST EXAM "Limited Ultrasound of the Abdomen and Pericardium" (FAST Exam).   INDICATIONS:Abnornal vitals Multiple views of the abdomen and pericardium are obtained with a multi-frequency probe.  PERFORMED BY: Myself IMAGES ARCHIVED?: Yes LIMITATIONS:  Emergent procedure INTERPRETATION:  Abdominal free fluid present     Medications Ordered in ED Medications  0.9 %  sodium chloride  infusion (has no administration in time range)  sodium chloride 0.9 % bolus 1,000 mL (1,000 mLs Intravenous New Bag/Given 04/22/18 0007)     Initial Impression / Assessment and Plan / ED Course  I have reviewed the triage vital signs and the nursing notes.  Pertinent labs results that were available during my care of the patient were reviewed by me and considered in my medical decision making (see chart for details).     12:20 AM Patient with abdominal pain for 1 day that is rapidly worsening.  She had significant abdominal tenderness on exam.  She was found to be pregnant, and previous history of ectopic pregnancy.  On bedside ultrasound she is noted to have free fluid in her abdomen, and no signs of IUP in the uterus.  I am concerned for ruptured ectopic pregnancy.  I have ordered 2 IVs with IV fluids. I discussed the case with Dr. Elonda Husky, and he plans to manage this operatively. 12:30  AM Dr. Elonda Husky at bedside, to admit patient Final Clinical Impressions(s) / ED Diagnoses   Final diagnoses:  Generalized abdominal pain  Pregnancy, unspecified gestational age  Hemoperitoneum    ED Discharge Orders    None       Ripley Fraise, MD 04/22/18 0030

## 2018-04-22 NOTE — Discharge Instructions (Signed)
Ruptured Ectopic Pregnancy An ectopic pregnancy is when the fertilized egg attaches (implants) outside the uterus. Most ectopic pregnancies occur in the fallopian tube. Rarely do ectopic pregnancies occur on the ovary, intestine, pelvis, or cervix. An ectopic pregnancy does not have the ability to develop into a normal, healthy baby. A ruptured ectopic pregnancy is one in which the fallopian tube gets torn or bursts and results in internal bleeding. Often there is intense abdominal pain, and sometimes, vaginal bleeding. Having an ectopic pregnancy can be a life-threatening experience. If left untreated, this dangerous condition can lead to a blood transfusion, abdominal surgery, or even death. What are the causes? Damage to the fallopian tubes is the suspected cause in most ectopic pregnancies. What increases the risk? Depending on your circumstances, the amount of risk of having an ectopic pregnancy will vary. There are 3 categories that may help you identify whether you are potentially at risk. High Risk  You have gone through infertility treatment.  You have had a previous ectopic pregnancy.  You have had previous tubal surgery.  You have had previous surgery to have the fallopian tubes tied (tubal ligation).  You have tubal problems or diseases.  You have been exposed to DES. DES is a medicine that was used until 1971 and had effects on babies whose mothers took the medicine.  You become pregnant while using an intrauterine device (IUD) for birth control.  Moderate Risk  You have a history of infertility.  You have a history of a sexually transmitted infection (STI).  You have a history of pelvic inflammatory disease (PID).  You have scarring from endometriosis.  You have multiple sexual partners.  You smoke.  Low Risk  You have had previous pelvic surgery.  You use vaginal douching.  You became sexually active before 40 years of age.  What are the signs or  symptoms? An ectopic pregnancy should be suspected in anyone who has missed a period and has abdominal pain or bleeding.  You may experience normal pregnancy symptoms, such as: ? Nausea. ? Tiredness. ? Breast tenderness.  Symptoms that are not normal include: ? Pain with intercourse. ? Irregular vaginal bleeding or spotting. ? Cramping or pain on one side, or in the lower abdomen. ? Fast heartbeat. ? Passing out while having a bowel movement.  Symptoms of a ruptured ectopic pregnancy and internal bleeding may include: ? Sudden, severe pain in the abdomen and pelvis. ? Dizziness or fainting. ? Pain in the shoulder area.  How is this diagnosed? Tests that may be performed include:  A pregnancy test.  An ultrasound.  Testing the specific level of pregnancy hormone in the bloodstream.  Taking a sample of uterus tissue (dilation and curettage, D&C).  Surgery to perform a visual exam of the inside of the abdomen using a lighted tube (laparoscopy).  How is this treated? Laparoscopic surgery or abdominal surgery is recommended for a ruptured ectopic pregnancy.  The whole fallopian tube may need to be removed (salpingectomy).  If the tube is not too damaged, the tube may be saved, and the pregnancy will be surgically removed. Intime, the tube may still function.  If you have lost a lot of blood, you may need a blood transfusion.  You may receive a Rho (D) immune globulin shot if you are Rh negative and the father is Rh positive, or if you do not know the Rh type of the father. This is to prevent problems with any future pregnancy.  Get help right  away if: You have any symptoms of an ectopic or ruptured ectopic pregnancy. This is a medical emergency. This information is not intended to replace advice given to you by your health care provider. Make sure you discuss any questions you have with your health care provider. Document Released: 10/05/2000 Document Revised: 04/27/2016  Document Reviewed: 07/20/2013 Elsevier Interactive Patient Education  [employer].

## 2018-04-22 NOTE — H&P (Signed)
Preoperative History and Physical  Madison [name] is a 42 y.o. [id number] with Patient's last menstrual period was 12/09/2017 (exact date). admitted for a laparoscopic surgical management of a ruptured ectopic pregnancy Pt presented to ED with 36 hours lower abdominal pain which has worsened. Quantitative HCG is 32 936 and bedside sonogram which I repeated reveals free fluid in the pelvis and empty uterus.  Hemoglobin is stable Pt aware and agrees with  Management decision  PMH:    Past Medical History:  Diagnosis Date  . Abnormal Pap smear of cervix   . Dysplasia of cervix   . Fatigue 11/30/2013  . Fibroids 11/30/2013  . Gestational diabetes   . History of abnormal cervical Pap smear 11/30/2013  . Umbilical hernia   . Uterine fibroids affecting pregnancy     PSH:     Past Surgical History:  Procedure Laterality Date  . CERVICAL CONE BIOPSY    . CESAREAN SECTION      POb/GynH:      OB History    Gravida  4   Para  2   Term  2   Preterm      AB  1   Living  2     SAB      TAB      Ectopic  1   Multiple      Live Births  2           SH:   Social History   Tobacco Use  . Smoking status: Current Every Day Smoker    Packs/day: 0.50    Types: Cigarettes  . Smokeless tobacco: Never Used  Substance Use Topics  . Alcohol use: No    Frequency: Never    Comment: occ.  . Drug use: No    FH:    Family History  Problem Relation Age of Onset  . Hypertension Mother   . Diabetes Mother   . Hypertension Father   . Diabetes Father   . Heart attack Father   . Hypertension Maternal Grandmother   . COPD Maternal Grandmother   . Hypertension Maternal Grandfather   . Hypertension Paternal Grandmother   . Hypertension Paternal Grandfather      Allergies: No Known Allergies  Medications:       Current Facility-Administered Medications:  .  0.9 %  sodium chloride infusion, , Intravenous, Once, Ripley Fraise, MD  Current Outpatient Medications:  Marland Kitchen   Multiple Vitamin (MULTIVITAMIN) tablet, Take 1 tablet by mouth daily., Disp: , Rfl:   Review of Systems:   Review of Systems  Constitutional: Negative for fever, chills, weight loss, malaise/fatigue and diaphoresis.  HENT: Negative for hearing loss, ear pain, nosebleeds, congestion, sore throat, neck pain, tinnitus and ear discharge.   Eyes: Negative for blurred vision, double vision, photophobia, pain, discharge and redness.  Respiratory: Negative for cough, hemoptysis, sputum production, shortness of breath, wheezing and stridor.   Cardiovascular: Negative for chest pain, palpitations, orthopnea, claudication, leg swelling and PND.  Gastrointestinal: Positive for abdominal pain. Negative for heartburn, nausea, vomiting, diarrhea, constipation, blood in stool and melena.  Genitourinary: Negative for dysuria, urgency, frequency, hematuria and flank pain.  Musculoskeletal: Negative for myalgias, back pain, joint pain and falls.  Skin: Negative for itching and rash.  Neurological: Negative for dizziness, tingling, tremors, sensory change, speech change, focal weakness, seizures, loss of consciousness, weakness and headaches.  Endo/Heme/Allergies: Negative for environmental allergies and polydipsia. Does not bruise/bleed easily.  Psychiatric/Behavioral: Negative for depression, suicidal ideas, hallucinations,  memory loss and substance abuse. The patient is not nervous/anxious and does not have insomnia.      PHYSICAL EXAM:  Blood pressure 113/88, pulse 80, temperature 98.1 F (36.7 C), temperature source Temporal, resp. rate 11, height 5\' 2"  (1.575 m), weight 130 lb (59 kg), last menstrual period 12/09/2017, SpO2 100 %.    Vitals reviewed. Constitutional: She is oriented to person, place, and time. She appears well-developed and well-nourished.  HENT:  Head: Normocephalic and atraumatic.  Right Ear: External ear normal.  Left Ear: External ear normal.  Nose: Nose normal.  Mouth/Throat:  Oropharynx is clear and moist.  Eyes: Conjunctivae and EOM are normal. Pupils are equal, round, and reactive to light. Right eye exhibits no discharge. Left eye exhibits no discharge. No scleral icterus.  Neck: Normal range of motion. Neck supple. No tracheal deviation present. No thyromegaly present.  Cardiovascular: Normal rate, regular rhythm, normal heart sounds and intact distal pulses.  Exam reveals no gallop and no friction rub.   No murmur heard. Respiratory: Effort normal and breath sounds normal. No respiratory distress. She has no wheezes. She has no rales. She exhibits no tenderness.  GI: Soft. Bowel sounds are normal. She exhibits no distension and no mass. There is moderate tenderness. There is  rebound and  Guarding consistent with blood in the belly.  Genitourinary:       Vulva is normal without lesions Vagina is pink moist without discharge Cervix normal in appearance and pap is normal Uterus is small empty on sonogram Adnexa is negative with normal sized ovaries by sonogram  Musculoskeletal: Normal range of motion. She exhibits no edema and no tenderness.  Neurological: She is alert and oriented to person, place, and time. She has normal reflexes. She displays normal reflexes. No cranial nerve deficit. She exhibits normal muscle tone. Coordination normal.  Skin: Skin is warm and dry. No rash noted. No erythema. No pallor.  Psychiatric: She has a normal mood and affect. Her behavior is normal. Judgment and thought content normal.    Labs: Results for orders placed or performed during the hospital encounter of 04/21/18 (from the past 336 hour(s))  Urinalysis, Routine w reflex microscopic   Collection Time: 04/21/18 10:14 PM  Result Value Ref Range   Color, Urine YELLOW YELLOW   APPearance HAZY (A) CLEAR   Specific Gravity, Urine 1.015 1.005 - 1.030   pH 5.0 5.0 - 8.0   Glucose, UA NEGATIVE NEGATIVE mg/dL   Hgb urine dipstick SMALL (A) NEGATIVE   Bilirubin Urine NEGATIVE  NEGATIVE   Ketones, ur 20 (A) NEGATIVE mg/dL   Protein, ur NEGATIVE NEGATIVE mg/dL   Nitrite NEGATIVE NEGATIVE   Leukocytes, UA NEGATIVE NEGATIVE   RBC / HPF 0-5 0 - 5 RBC/hpf   WBC, UA 6-10 0 - 5 WBC/hpf   Bacteria, UA NONE SEEN NONE SEEN   Squamous Epithelial / LPF 6-10 0 - 5   Mucus PRESENT   Lipase, blood   Collection Time: 04/21/18 10:31 PM  Result Value Ref Range   Lipase 30 11 - 51 U/L  Comprehensive metabolic panel   Collection Time: 04/21/18 10:31 PM  Result Value Ref Range   Sodium 137 135 - 145 mmol/L   Potassium 3.5 3.5 - 5.1 mmol/L   Chloride 103 98 - 111 mmol/L   CO2 24 22 - 32 mmol/L   Glucose, Bld 101 (H) 70 - 99 mg/dL   BUN 19 6 - 20 mg/dL   Creatinine, Ser 0.71 0.44 -  1.00 mg/dL   Calcium 8.8 (L) 8.9 - 10.3 mg/dL   Total Protein 7.4 6.5 - 8.1 g/dL   Albumin 4.4 3.5 - 5.0 g/dL   AST 17 15 - 41 U/L   ALT 9 0 - 44 U/L   Alkaline Phosphatase 45 38 - 126 U/L   Total Bilirubin 0.8 0.3 - 1.2 mg/dL   GFR calc non Af Amer >60 >60 mL/min   GFR calc Af Amer >60 >60 mL/min   Anion gap 10 5 - 15  CBC   Collection Time: 04/21/18 10:31 PM  Result Value Ref Range   WBC 13.9 (H) 4.0 - 10.5 K/uL   RBC 3.34 (L) 3.87 - 5.11 MIL/uL   Hemoglobin 11.3 (L) 12.0 - 15.0 g/dL   HCT 32.4 (L) 36.0 - 46.0 %   MCV 97.0 78.0 - 100.0 fL   MCH 33.8 26.0 - 34.0 pg   MCHC 34.9 30.0 - 36.0 g/dL   RDW 12.1 11.5 - 15.5 %   Platelets 180 150 - 400 K/uL  hCG, quantitative, pregnancy   Collection Time: 04/21/18 10:31 PM  Result Value Ref Range   hCG, Beta Chain, Quant, S 32,936 (H) <5 mIU/mL  POC urine preg, ED (not at [facility])   Collection Time: 04/22/18 12:01 AM  Result Value Ref Range   Preg Test, Ur POSITIVE (A) NEGATIVE    EKG: No orders found for this or any previous visit.  Imaging Studies: Bedside sonogram results per HPI   Assessment:   Ruptured ectopic pregnancy, unknown laterality   Patient Active Problem List   Diagnosis Date Noted  . Fibroids 11/30/2013  .  Umbilical hernia 16/38/4665  . History of abnormal cervical Pap smear 11/30/2013  . Fatigue 11/30/2013    Plan: Laparoscopic surgical management of ruptured ectopic pregnancy, probable salpingectomy  Florian Buff 04/22/2018 12:28 AM

## 2018-04-22 NOTE — ED Notes (Signed)
Patient has 2 fluid bolus of  Normal saline running at this time.

## 2018-04-22 NOTE — Anesthesia Postprocedure Evaluation (Signed)
Anesthesia Post Note  Patient: Analya A Gadsby  Procedure(s) Performed: DIAGNOSTIC LAPAROSCOPY WITH REMOVAL OF ECTOPIC PREGNANCY (N/A Vagina ) RIGHT   SALPINGECTOMY (Right )  Patient location during evaluation: PACU Anesthesia Type: General Level of consciousness: awake and alert and patient cooperative Pain management: satisfactory to patient Vital Signs Assessment: post-procedure vital signs reviewed and stable Respiratory status: spontaneous breathing Cardiovascular status: stable Postop Assessment: no apparent nausea or vomiting Anesthetic complications: no     Last Vitals:  Vitals:   04/22/18 0400 04/22/18 0415  BP: (!) 84/52 (!) 92/42  Pulse: 66 71  Resp: 19 18  Temp:    SpO2: 100% 100%    Last Pain:  Vitals:   04/22/18 0338  TempSrc:   PainSc: Asleep                 Carmaleta Youngers

## 2018-04-22 NOTE — Anesthesia Preprocedure Evaluation (Signed)
Anesthesia Evaluation  Patient identified by MRN, date of birth, ID band Patient awake    Reviewed: Allergy & Precautions, H&P , NPO status , Patient's Chart, lab work & pertinent test results, reviewed documented beta blocker date and time   Airway Mallampati: I  TM Distance: >3 FB Neck ROM: full    Dental no notable dental hx. (+) Teeth Intact, Dental Advidsory Given   Pulmonary neg pulmonary ROS, Current Smoker,    Pulmonary exam normal breath sounds clear to auscultation       Cardiovascular Exercise Tolerance: Good negative cardio ROS   Rhythm:regular Rate:Normal     Neuro/Psych negative neurological ROS  negative psych ROS   GI/Hepatic negative GI ROS, Neg liver ROS,   Endo/Other  negative endocrine ROS  Renal/GU negative Renal ROS  negative genitourinary   Musculoskeletal   Abdominal   Peds  Hematology negative hematology ROS (+)   Anesthesia Other Findings Meal at 1800  Reproductive/Obstetrics negative OB ROS                             Anesthesia Physical Anesthesia Plan  ASA: II and emergent  Anesthesia Plan: General ETT   Post-op Pain Management:    Induction:   PONV Risk Score and Plan:   Airway Management Planned:   Additional Equipment:   Intra-op Plan:   Post-operative Plan:   Informed Consent: I have reviewed the patients History and Physical, chart, labs and discussed the procedure including the risks, benefits and alternatives for the proposed anesthesia with the patient or authorized representative who has indicated his/her understanding and acceptance.   Dental Advisory Given  Plan Discussed with: CRNA and Anesthesiologist  Anesthesia Plan Comments:         Anesthesia Quick Evaluation

## 2018-04-22 NOTE — Op Note (Signed)
Preoperative diagnosis: Ruptured ectopic pregnancy  Postoperative diagnosis: Ruptured right ectopic pregnancy with 200 cc hemoperitoneum   Procedure: Laparoscopic removal of right ectopic pregnancy and right salpingectomy   Surgeon: Florian Buff   Anesthesia: Gen. Endotracheal   Findings:   Intraoperatively the patient had about 200 cc of  clotted blood there was no active bleeding.  The entire middle half of the tube was ruptured and was not salvageable. The left fallopian tube appeared to be normal without any evidence of hydrosalpinx adhesions and the fimbria were normal.   Description of operation: Patient was taken to the operating room and placed in the supine position where she underwent general endotracheal anesthesia. She was placed in dorsal lithotomy position. She was prepped and draped in usual sterile fashion. Foley catheter was placed. Incision was made in the umbilicus and a varies needle was placed peritoneal cavity with one pass that difficulty. The peritoneal cavity was insufflated. A 1011 non-bladed trocar was placed using a video laparoscope under direct visualization without difficulty. An incision was made in the right and left lower quadrant and 5 mm non-bladed trochars were placed in each site without difficulty under direct visualization. There was a significant amount of blood in the belly the time of surgery and I estimated at 200cc judging by what I was able to suction out.    The right fallopian tube had been ruptured. Harmonic scalpel was used and a salpingectomy was performed. There was good hemostasis. The pelvis was irrigated and hemostasis once again confirmed. The left fallopian tube was completely normal and there were no other intraperitoneal abnormalities appreciated.   The trochars were removed and the gas was allowed to escape from the abdomen. The 2-5 mm trocar sites were closed with staples and injected with a total of 10 cc of exparel. The umbilical  fascia was closed with single 2-0 Vicryl suture and the subcutaneous tissue was also closed using Vicryl. The skin was closed using skin staples. 10 cc of expael was injected here as well.   The patient remained hemodynamically stable throughout the entire procedure was awakened from anesthesia and taken to the recovery room in good stable condition with all counts being correct.   She received 2 g of Ancef and 30 mg of Toradol prophylactically. There was no real intraoperative blood loss only the hemoperitoneum which was appreciated and present upon peritoneal entry.   Florian Buff, MD 04/22/2018 3:29 AM

## 2018-04-22 NOTE — Anesthesia Procedure Notes (Signed)
Procedure Name: Intubation Performed by: Vena Rua, MD Pre-anesthesia Checklist: Patient identified, Emergency Drugs available, Suction available, Patient being monitored and Timeout performed Patient Re-evaluated:Patient Re-evaluated prior to induction Oxygen Delivery Method: Circle system utilized and Simple face mask Preoxygenation: Pre-oxygenation with 100% oxygen Induction Type: IV induction, Cricoid Pressure applied and Rapid sequence Ventilation: Mask ventilation without difficulty Laryngoscope Size: Mac and 3 Grade View: Grade II Tube type: Oral Tube size: 7.0 mm Number of attempts: 1 Airway Equipment and Method: Oral airway Placement Confirmation: ETT inserted through vocal cords under direct vision,  positive ETCO2 and breath sounds checked- equal and bilateral Secured at: 21 cm Tube secured with: Tape Dental Injury: Teeth and Oropharynx as per pre-operative assessment

## 2018-04-23 ENCOUNTER — Encounter (HOSPITAL_COMMUNITY): Payer: Self-pay | Admitting: Obstetrics & Gynecology

## 2018-04-29 ENCOUNTER — Encounter: Payer: Self-pay | Admitting: Obstetrics & Gynecology

## 2018-04-29 ENCOUNTER — Ambulatory Visit (INDEPENDENT_AMBULATORY_CARE_PROVIDER_SITE_OTHER): Payer: BLUE CROSS/BLUE SHIELD | Admitting: Obstetrics & Gynecology

## 2018-04-29 VITALS — BP 93/57 | HR 74 | Ht 62.0 in | Wt 122.2 lb

## 2018-04-29 DIAGNOSIS — Z9889 Other specified postprocedural states: Secondary | ICD-10-CM

## 2018-04-29 MED ORDER — NORETHINDRONE 0.35 MG PO TABS: 1.0000 | ORAL_TABLET | Freq: Every day | ORAL | 11 refills | Status: AC

## 2018-04-29 NOTE — Progress Notes (Signed)
  HPI: Patient returns for routine postoperative follow-up having undergone laparoscopic right salpingectomy on 04/22/2018.  The patient's immediate postoperative recovery has been unremarkable. Since hospital discharge the patient reports no problems.   Current Outpatient Medications: HYDROcodone-acetaminophen (NORCO/VICODIN) 5-325 MG tablet, Take 1 tablet by mouth every 6 (six) hours as needed., Disp: 15 tablet, Rfl: 0 ketorolac (TORADOL) 10 MG tablet, Take 1 tablet (10 mg total) by mouth every 8 (eight) hours as needed., Disp: 15 tablet, Rfl: 0 Multiple Vitamin (MULTIVITAMIN) tablet, Take 1 tablet by mouth daily., Disp: , Rfl:  promethazine (PHENERGAN) 25 MG tablet, Take 1 tablet (25 mg total) by mouth every 6 (six) hours as needed for nausea. (Patient not taking: Reported on 04/29/2018), Disp: 30 tablet, Rfl: 1  No current facility-administered medications for this visit.     Blood pressure (!) 93/57, pulse 74, height 5\' 2"  (1.575 m), weight 122 lb 3.2 oz (55.4 kg), last menstrual period 12/09/2017.  Physical Exam: All 3 incisions clean dry intact Abdomen soft normal post op  Diagnostic Tests:   Pathology: Ectopic pregnancy  Impression: S/p laparoscopic removal of ruptured ectopic pregnancy  Plan:   Follow up: prn   Return if symptoms worsen or fail to improve, for with Dr Elonda Husky.   Florian Buff, MD

## 2018-04-29 NOTE — Addendum Note (Signed)
Addended by: Florian Buff on: 04/29/2018 02:35 PM   Modules accepted: Orders

## 2019-06-23 ENCOUNTER — Other Ambulatory Visit: Payer: Self-pay

## 2019-06-23 ENCOUNTER — Encounter: Payer: Self-pay | Admitting: Women's Health

## 2019-06-23 ENCOUNTER — Ambulatory Visit (INDEPENDENT_AMBULATORY_CARE_PROVIDER_SITE_OTHER): Payer: BC Managed Care – PPO | Admitting: Women's Health

## 2019-06-23 VITALS — BP 100/60 | HR 68 | Ht 62.0 in | Wt 117.2 lb

## 2019-06-23 DIAGNOSIS — N926 Irregular menstruation, unspecified: Secondary | ICD-10-CM | POA: Diagnosis not present

## 2019-06-23 DIAGNOSIS — Z3202 Encounter for pregnancy test, result negative: Secondary | ICD-10-CM | POA: Diagnosis not present

## 2019-06-23 LAB — POCT URINE PREGNANCY: Preg Test, Ur: NEGATIVE

## 2019-06-23 NOTE — Progress Notes (Signed)
   GYN VISIT Patient name: Madison [name] MRN [id number]  Date of birth: 1976-07-21 Chief Complaint:   + home pregnancy test (period 8-5 x 7 days, back on 8-28 x 4 days. spotting now)  History of Present Illness:   Madison [name] is a 43 y.o. [phone] Caucasian female being seen today for recent + home preg test on 06/17/19 followed by bleeding on 8/28 with spotting now.   Patient's last menstrual period was 05/27/2019. The current method of family planning is condoms.  Last pap 11/2017. Results were:  normal Review of Systems:   Pertinent items are noted in HPI Denies fever/chills, dizziness, headaches, visual disturbances, fatigue, shortness of breath, chest pain, abdominal pain, vomiting, abnormal vaginal discharge/itching/odor/irritation, problems with periods, bowel movements, urination, or intercourse unless otherwise stated above.  Pertinent History Reviewed:  Reviewed past medical,surgical, social, obstetrical and family history.  Reviewed problem list, medications and allergies. Physical Assessment:   Vitals:   06/23/19 0952  BP: 100/60  Pulse: 68  Weight: 117 lb 3.2 oz (53.2 kg)  Height: 5\' 2"  (1.575 m)  Body mass index is 21.44 kg/m.       Physical Examination:   General appearance: alert, well appearing, and in no distress  Mental status: alert, oriented to person, place, and time  Skin: warm & dry   Cardiovascular: normal heart rate noted  Respiratory: normal respiratory effort, no distress  Abdomen: soft, non-tender   Pelvic: examination not indicated  Extremities: no edema   Results for orders placed or performed in visit on 06/23/19 (from the past 24 hour(s))  POCT urine pregnancy   Collection Time: 06/23/19 10:03 AM  Result Value Ref Range   Preg Test, Ur Negative Negative    Assessment & Plan:  1) Irreg periods> short cycles q 24-27days  2) Probable false positive preg test> negative in office, 6 days after positive at home  Meds: No orders of the  defined types were placed in this encounter.   Orders Placed This Encounter  Procedures  . POCT urine pregnancy    No follow-ups on file. Will return in 1 yr for physical exam.  [facility]rt[facility] Ser [facility] 06/23/2019 10:23 AM
